# Patient Record
Sex: Female | Born: 1995 | Race: Black or African American | Hispanic: No | Marital: Single | Smoking: Never smoker
Health system: Southern US, Community
[De-identification: ages and names within clinical notes are randomized; demographics above are authoritative.]

## PROBLEM LIST (undated history)

## (undated) HISTORY — PX: NO PAST SURGERIES: SHX2092

---

## 2018-09-03 ENCOUNTER — Ambulatory Visit
Admission: EM | Admit: 2018-09-03 | Discharge: 2018-09-03 | Disposition: A | Discharge status: Home / Self Care | Payer: Self-pay | Attending: Urgent Care | Admitting: Urgent Care

## 2018-09-03 ENCOUNTER — Encounter: Payer: Self-pay | Admitting: Emergency Medicine

## 2018-09-03 ENCOUNTER — Other Ambulatory Visit: Payer: Self-pay

## 2018-09-03 DIAGNOSIS — N390 Urinary tract infection, site not specified: Secondary | ICD-10-CM

## 2018-09-03 DIAGNOSIS — Z3202 Encounter for pregnancy test, result negative: Secondary | ICD-10-CM

## 2018-09-03 LAB — URINALYSIS, COMPLETE (UACMP) WITH MICROSCOPIC
Bilirubin Urine: NEGATIVE
Glucose, UA: NEGATIVE mg/dL
Hgb urine dipstick: NEGATIVE
Ketones, ur: 15 mg/dL — AB
Leukocytes,Ua: NEGATIVE
Nitrite: POSITIVE — AB
Protein, ur: NEGATIVE mg/dL
RBC / HPF: NONE SEEN RBC/hpf (ref 0–5)
Specific Gravity, Urine: 1.025 (ref 1.005–1.030)
pH: 6.5 (ref 5.0–8.0)

## 2018-09-03 LAB — PREGNANCY, URINE: Preg Test, Ur: NEGATIVE

## 2018-09-03 MED ORDER — NITROFURANTOIN MONOHYD MACRO 100 MG PO CAPS: 100.0000 mg | ORAL_CAPSULE | Freq: Two times a day (BID) | ORAL | 0 refills | Status: AC

## 2018-09-03 NOTE — Discharge Instructions (Addendum)
It was very nice seeing you today in clinic. Thank you for entrusting me with your care.   As discussed, your urine is POSITIVE for infection. Will approach treatment as follows:  Prescription has been sent to your pharmacy for antibiotics.  Please pick up and take as directed. FINISH the entire course of medication even if you are feeling better.  A culture will be sent on your provided sample. If it comes back resistant to what I have prescribed you, someone will call you and let you know that we will need to change antibiotics. Increase fluid intake as much as possible to flush your urinary tract.  Water is always the best.  Avoid caffeine until your infection clears up, as it can contribute to painful bladder spasms.  May use Tylenol and/or Ibuprofen as needed for pain/fever. May use Azo products (over the counter) to help with pain/discomfort.   Make arrangements to follow up with your regular doctor in 1 week for re-evaluation. If your symptoms/condition worsens, please seek follow up care either here or in the ER. Please remember, our [facility] providers are "right here with you" when you need us.   Again, it was my pleasure to take care of you today. Thank you for choosing our clinic. I hope that you start to feel better quickly.   Ziza Hastings, MSN, APRN, FNP-C, CEN Advanced Practice Provider [facility] 

## 2018-09-03 NOTE — ED Triage Notes (Signed)
Pt c/o urinary frequency. Started 2 days ago. Denies dysuria or fever.

## 2018-09-04 NOTE — ED Provider Notes (Signed)
[place], [place]   Name: Adrienne Garner DOB: 12/09/1995 MRN: [id number] CSN: [id number] PCP: Patient, No Pcp Per  Arrival date and time:  09/03/18 1440  Chief Complaint:  Urinary Frequency   NOTE: Prior to seeing the patient today, I have reviewed the triage nursing documentation and vital signs. Clinical staff has updated patient's PMH/PSHx, current medication list, and drug allergies/intolerances to ensure comprehensive history available to assist in medical decision making.   History:   HPI: Adrienne Garner is a 23 y.o. female who presents today with complaints of urinary symptoms that began with acute onset 2 days ago. She complains frequency, but denies dysuria and urgency. Patient states, "this feels just like my UTIs have in the past".  She has not appreciated any gross hematuria, nor has she noticed her urine being malodorous. Patient denies any associated nausea, vomiting, fever, and chills. She has not experienced any pain in her lower back, flank area, or abdomen. Patient advises that she has a significant history for recurrent urinary tract infections. She denies any vaginal pain, bleeding, or discharge. Patient's last menstrual period was 08/02/2018 (approximate). There are no concerns that she is currently pregnant.   History reviewed. No pertinent past medical history.  Past Surgical History:  Procedure Laterality Date  . NO PAST SURGERIES      Family History  Problem Relation Age of Onset  . Diabetes Mother     Social History   Tobacco Use  . Smoking status: Never Smoker  . Smokeless tobacco: Never Used  Substance Use Topics  . Alcohol use: Not Currently  . Drug use: Not Currently    There are no active problems to display for this patient.   Home Medications:    No outpatient medications have been marked as taking for the 09/03/18 encounter [employer] Encounter).    Allergies:   Patient has no known allergies.  Review of Systems (ROS): Review of Systems   Constitutional: Negative for chills and fever.  Respiratory: Negative for cough and shortness of breath.   Cardiovascular: Negative for chest pain and palpitations.  Genitourinary: Positive for frequency. Negative for dyspareunia, dysuria, flank pain, genital sores, hematuria, pelvic pain, urgency, vaginal discharge and vaginal pain.  Musculoskeletal: Negative for back pain.  Neurological: Negative for dizziness, syncope, weakness and headaches.  All other systems reviewed and are negative.    Vital Signs: Today's Vitals   09/03/18 1456 09/03/18 1500 09/03/18 1608  BP:  102/74   Pulse:  71   Resp:  16   Temp:  98.3 F (36.8 C)   TempSrc:  Oral   SpO2:  100%   Weight: 98 lb (44.5 kg)    Height: 4\' 9"  (1.448 m)    PainSc: 2   2     Physical Exam: Physical Exam  Constitutional: She is oriented to person, place, and time and well-developed, well-nourished, and in no distress.  HENT:  Head: Normocephalic and atraumatic.  Mouth/Throat: Mucous membranes are normal.  Eyes: Pupils are equal, round, and reactive to light. EOM are normal.  Cardiovascular: Normal rate, regular rhythm, normal heart sounds and intact distal pulses. Exam reveals no gallop and no friction rub.  No murmur heard. Pulmonary/Chest: Effort normal and breath sounds normal. No respiratory distress. She has no wheezes. She has no rales.  Abdominal: Soft. Normal appearance and bowel sounds are normal. There is no hepatosplenomegaly. There is abdominal tenderness in the suprapubic area. There is no CVA tenderness.  Neurological: She is alert and oriented to  person, place, and time. Gait normal. GCS score is 15.  Skin: Skin is warm and dry. No rash noted.  Psychiatric: Mood, memory, affect and judgment normal.  Nursing note and vitals reviewed.   Urgent Care Treatments / Results:   LABS: PLEASE NOTE: all labs that were ordered this encounter are listed, however only abnormal results are displayed. Labs Reviewed   URINALYSIS, COMPLETE (UACMP) WITH MICROSCOPIC - Abnormal; Notable for the following components:      Result Value   APPearance HAZY (*)    Ketones, ur 15 (*)    Nitrite POSITIVE (*)    Bacteria, UA MANY (*)    All other components within normal limits  URINE CULTURE  PREGNANCY, URINE    EKG: -None  RADIOLOGY: No results found.  PROCEDURES: Procedures  MEDICATIONS RECEIVED THIS VISIT: Medications - No data to display  PERTINENT CLINICAL COURSE NOTES/UPDATES:   Initial Impression / Assessment and Plan / Urgent Care Course:  Pertinent labs & imaging results that were available during my care of the patient were personally reviewed by me and considered in my medical decision making (see lab/imaging section of note for values and interpretations).  Adrienne GuessShanice Garner is a 23 y.o. female who [facility] with complaints of Urinary Frequency   Patient is well appearing overall in clinic today. She does not appear to be in any acute distress. Presenting symptoms (see HPI) and exam as documented above. Urine HCG negative for pregnancy. UA was (+) for infection; reflex culture sent. Will treat with a 5 day course of Nitrofurantoin. Patient encouraged to complete the entire course of antibiotics even if she begins to feel better. She was advised that if culture demonstrates resistance to the prescribed antibiotic, she will be contacted and advised of the need to change the antibiotic being used to treat her infection. Patient encouraged to increase her fluid intake as much as possible. Discussed that water is always best to flush the urinary tract. She was advised to avoid caffeine containing fluids until her infections clears, as caffeine can cause her to experience painful bladder spasms. May use Tylenol and/or Ibuprofen as needed for pain/fever. May also use over the counter phenazopyridine to help relieve her current urinary pain.   Discussed follow up with primary care  physician in 1 week for re-evaluation. I have reviewed the follow up and strict return precautions for any new or worsening symptoms. Patient is aware of symptoms that would be deemed urgent/emergent, and would thus require further evaluation either here or in the emergency department. At the time of discharge, she verbalized understanding and consent with the discharge plan as it was reviewed with her. All questions were fielded by provider and/or clinic staff prior to patient discharge.    Final Clinical Impressions / Urgent Care Diagnoses:   Final diagnoses:  Urinary tract infection without hematuria, site unspecified    New Prescriptions:  [place] Controlled Substance Registry consulted? Not Applicable  Meds ordered this encounter  Medications  . nitrofurantoin, macrocrystal-monohydrate, (MACROBID) 100 MG capsule    Sig: Take 1 capsule (100 mg total) by mouth 2 (two) times daily.    Dispense:  10 capsule    Refill:  0    Recommended Follow up Care:  Patient encouraged to follow up with the following provider within the specified time frame, or sooner as dictated by the severity of her symptoms. As always, she was instructed that for any urgent/emergent care needs, she should seek care either  here or in the emergency department for more immediate evaluation.  NOTE: This note was prepared using Scientist, clinical (histocompatibility and immunogenetics)Dragon dictation software along with smaller Lobbyistphrase technology. Despite my best ability to proofread, there is the potential that transcriptional errors may still occur from this process, and are completely unintentional.    Verlee MonteGray, Jadelynn Boylan E, NP 09/04/18 1211

## 2018-09-06 LAB — URINE CULTURE: Culture: >=100000 — AB

## 2018-09-09 ENCOUNTER — Telehealth (HOSPITAL_COMMUNITY): Payer: Self-pay | Admitting: Emergency Medicine

## 2018-09-09 NOTE — Telephone Encounter (Signed)
Urine culture was positive for e coli and was given  macrobid at urgent care visit. Pt contacted and made aware, educated on completing antibiotic and to follow up if symptoms are persistent. Verbalized understanding.    

## 2020-04-19 ENCOUNTER — Emergency Department
Admission: EM | Admit: 2020-04-19 | Discharge: 2020-04-19 | Disposition: A | Discharge status: Home / Self Care | Payer: BLUE CROSS/BLUE SHIELD | Attending: Emergency Medicine | Admitting: Emergency Medicine

## 2020-04-19 ENCOUNTER — Emergency Department: Payer: BLUE CROSS/BLUE SHIELD

## 2020-04-19 ENCOUNTER — Encounter: Payer: Self-pay | Admitting: Emergency Medicine

## 2020-04-19 DIAGNOSIS — R112 Nausea with vomiting, unspecified: Secondary | ICD-10-CM | POA: Diagnosis present

## 2020-04-19 DIAGNOSIS — R197 Diarrhea, unspecified: Secondary | ICD-10-CM | POA: Insufficient documentation

## 2020-04-19 DIAGNOSIS — M549 Dorsalgia, unspecified: Secondary | ICD-10-CM | POA: Diagnosis not present

## 2020-04-19 DIAGNOSIS — R079 Chest pain, unspecified: Secondary | ICD-10-CM

## 2020-04-19 DIAGNOSIS — R0789 Other chest pain: Secondary | ICD-10-CM | POA: Diagnosis not present

## 2020-04-19 LAB — CBC
HCT: 39.5 % (ref 36.0–46.0)
Hemoglobin: 12.7 g/dL (ref 12.0–15.0)
MCH: 27.3 pg (ref 26.0–34.0)
MCHC: 32.2 g/dL (ref 30.0–36.0)
MCV: 84.9 fL (ref 80.0–100.0)
Platelets: 149 10*3/uL — ABNORMAL LOW (ref 150–400)
RBC: 4.65 MIL/uL (ref 3.87–5.11)
RDW: 14 % (ref 11.5–15.5)
WBC: 10.4 10*3/uL (ref 4.0–10.5)
nRBC: 0 % (ref 0.0–0.2)

## 2020-04-19 LAB — BASIC METABOLIC PANEL
Anion gap: 7 (ref 5–15)
BUN: 18 mg/dL (ref 6–20)
CO2: 22 mmol/L (ref 22–32)
Calcium: 9.4 mg/dL (ref 8.9–10.3)
Chloride: 109 mmol/L (ref 98–111)
Creatinine, Ser: 0.78 mg/dL (ref 0.44–1.00)
GFR, Estimated: >60 mL/min (ref >60)
Glucose, Bld: 119 mg/dL — ABNORMAL HIGH (ref 70–99)
Potassium: 3.6 mmol/L (ref 3.5–5.1)
Sodium: 138 mmol/L (ref 135–145)

## 2020-04-19 LAB — URINALYSIS, ROUTINE W REFLEX MICROSCOPIC
Bilirubin Urine: NEGATIVE
Glucose, UA: NEGATIVE mg/dL
Ketones, ur: 20 mg/dL — AB
Leukocytes,Ua: NEGATIVE
Nitrite: NEGATIVE
Protein, ur: 30 mg/dL — AB
RBC / HPF: >50 RBC/hpf — ABNORMAL HIGH (ref 0–5)
Specific Gravity, Urine: 1.032 — ABNORMAL HIGH (ref 1.005–1.030)
pH: 5 (ref 5.0–8.0)

## 2020-04-19 LAB — HEPATIC FUNCTION PANEL
ALT: 9 U/L (ref 0–44)
AST: 19 U/L (ref 15–41)
Albumin: 4.5 g/dL (ref 3.5–5.0)
Alkaline Phosphatase: 51 U/L (ref 38–126)
Bilirubin, Direct: 0.2 mg/dL (ref 0.0–0.2)
Indirect Bilirubin: 1 mg/dL — ABNORMAL HIGH (ref 0.3–0.9)
Total Bilirubin: 1.2 mg/dL (ref 0.3–1.2)
Total Protein: 7.9 g/dL (ref 6.5–8.1)

## 2020-04-19 LAB — TROPONIN I (HIGH SENSITIVITY)
Troponin I (High Sensitivity): <2 ng/L (ref <18)
Troponin I (High Sensitivity): 3 ng/L (ref <18)

## 2020-04-19 LAB — POC URINE PREG, ED: Preg Test, Ur: NEGATIVE

## 2020-04-19 LAB — LIPASE, BLOOD: Lipase: 39 U/L (ref 11–51)

## 2020-04-19 MED ORDER — KETOROLAC TROMETHAMINE 30 MG/ML IJ SOLN
30.0000 mg | Freq: Once | INTRAMUSCULAR | Status: AC
  Administered 2020-04-19: 30 mg via INTRAVENOUS
  Filled 2020-04-19: qty 1

## 2020-04-19 MED ORDER — SODIUM CHLORIDE 0.9 % IV BOLUS (SEPSIS)
1000.0000 mL | Freq: Once | INTRAVENOUS | Status: AC
  Administered 2020-04-19: 1000 mL via INTRAVENOUS

## 2020-04-19 MED ORDER — ONDANSETRON 4 MG PO TBDP: 4.0000 mg | ORAL_TABLET | Freq: Four times a day (QID) | ORAL | 0 refills | Status: AC | PRN

## 2020-04-19 NOTE — ED Triage Notes (Signed)
Pt arrived via [facility] from home where she called out due to continued N/V/D since 1800 on 4/3 and then this AM a new onset of left sided chest pain, 6/10 pain. Pt denies SOB. Pt is alert and oriented on arrival to ED.

## 2020-04-19 NOTE — Discharge Instructions (Signed)
You may alternate Tylenol 1000 mg every 6 hours as needed for pain, fever and Ibuprofen 800 mg every 8 hours as needed for pain, fever.  Please take Ibuprofen with food.  Do not take more than 4000 mg of Tylenol (acetaminophen) in a 24 hour period.  You may take over-the-counter Imodium as needed for diarrhea.  I recommend a bland diet for the next several days.

## 2020-04-19 NOTE — ED Provider Notes (Signed)
Adrienne Garner  ____________________________________________   Event Date/Time   First MD Initiated Contact with Patient 04/19/20 [phone]     (approximate)  I have reviewed the triage vital signs and the nursing notes.   HISTORY  Chief Complaint No chief complaint on file.    HPI Adrienne Garner is a 25 y.o. female with no significant past medical history who presents to the emergency department with complaints of nausea, vomiting diarrhea that started last night.  She states she had some abdominal discomfort but this improved after Zofran in triage.  She also reports having right-sided chest tightness that started after her vomiting and diffuse back pain.  She denies dysuria, hematuria, vaginal bleeding or discharge.  She has had a left salpingectomy secondary to ectopic pregnancy.  No bad food exposures.  No sick contacts.  No fever or cough.  No shortness of breath.  No history of PE, DVT, exogenous estrogen use, recent fractures, surgery, trauma, hospitalization, prolonged travel or other immobilization. No lower extremity swelling or pain. No calf tenderness.         History reviewed. No pertinent past medical history.  There are no problems to display for this patient.   Past Surgical History:  Procedure Laterality Date  . NO PAST SURGERIES      Prior to Admission medications   Medication Sig Start Date End Date Taking? Authorizing Provider  ondansetron (ZOFRAN ODT) 4 MG disintegrating tablet Take 1 tablet (4 mg total) by mouth every 6 (six) hours as needed for nausea or vomiting. 04/19/20  Yes Kaeo Jacome, Layla Maw, DO  nitrofurantoin, macrocrystal-monohydrate, (MACROBID) 100 MG capsule Take 1 capsule (100 mg total) by mouth 2 (two) times daily. 09/03/18   Verlee Monte, NP    Allergies Patient has no known allergies.  Family History  Problem Relation Age of Onset  . Diabetes Mother     Social History Social History    Tobacco Use  . Smoking status: Never Smoker  . Smokeless tobacco: Never Used  Vaping Use  . Vaping Use: Never used  Substance Use Topics  . Alcohol use: Not Currently  . Drug use: Not Currently    Review of Systems Constitutional: No fever. Eyes: No visual changes. ENT: No sore throat. Cardiovascular: + chest pain. Respiratory: Denies shortness of breath. Gastrointestinal: + nausea, vomiting, diarrhea. Genitourinary: Negative for dysuria. Musculoskeletal: Negative for back pain. Skin: Negative for rash. Neurological: Negative for focal weakness or numbness.  ____________________________________________   PHYSICAL EXAM:  VITAL SIGNS: ED Triage Vitals  Enc Vitals Group     BP 04/19/20 0623 107/66     Pulse Rate 04/19/20 0623 (!) 106     Resp 04/19/20 0623 16     Temp 04/19/20 0623 98.2 F (36.8 C)     Temp Source 04/19/20 0623 Oral     SpO2 04/19/20 0623 99 %     Weight 04/19/20 0419 102 lb (46.3 kg)     Height 04/19/20 0419 4\' 9"  (1.448 m)     Head Circumference --      Peak Flow --      Pain Score 04/19/20 0623 5     Pain Loc --      Pain Edu? --      Excl. in GC? --    CONSTITUTIONAL: Alert and oriented and responds appropriately to questions. Well-appearing; well-nourished HEAD: Normocephalic EYES: Conjunctivae clear, pupils appear equal, EOM appear intact ENT: normal nose; moist mucous membranes NECK:  Supple, normal ROM CARD: RRR; S1 and S2 appreciated; no murmurs, no clicks, no rubs, no gallops RESP: Normal chest excursion without splinting or tachypnea; breath sounds clear and equal bilaterally; no wheezes, no rhonchi, no rales, no hypoxia or respiratory distress, speaking full sentences ABD/GI: Normal bowel sounds; non-distended; soft, non-tender, no rebound, no guarding, no peritoneal signs, no hepatosplenomegaly BACK: The back appears normal, no CVA tenderness, no midline spinal tenderness or step-off or deformity EXT: Normal ROM in all joints; no  deformity noted, no edema; no cyanosis, no calf tenderness or calf swelling SKIN: Normal color for age and race; warm; no rash on exposed skin NEURO: Moves all extremities equally, normal gait PSYCH: The patient's mood and manner are appropriate.  ____________________________________________   LABS (all labs ordered are listed, but only abnormal results are displayed)  Labs Reviewed  BASIC METABOLIC PANEL - Abnormal; Notable for the following components:      Result Value   Glucose, Bld 119 (*)    All other components within normal limits  CBC - Abnormal; Notable for the following components:   Platelets 149 (*)    All other components within normal limits  HEPATIC FUNCTION PANEL - Abnormal; Notable for the following components:   Indirect Bilirubin 1.0 (*)    All other components within normal limits  URINALYSIS, ROUTINE W REFLEX MICROSCOPIC - Abnormal; Notable for the following components:   Color, Urine AMBER (*)    APPearance HAZY (*)    Specific Gravity, Urine 1.032 (*)    Hgb urine dipstick LARGE (*)    Ketones, ur 20 (*)    Protein, ur 30 (*)    RBC / HPF >50 (*)    Bacteria, UA RARE (*)    All other components within normal limits  LIPASE, BLOOD  POC URINE PREG, ED  TROPONIN I (HIGH SENSITIVITY)  TROPONIN I (HIGH SENSITIVITY)   ____________________________________________  EKG   EKG Interpretation  Date/Time:  Monday April 19 2020 04:20:18 EDT Ventricular Rate:  116 PR Interval:  134 QRS Duration: 88 QT Interval:  318 QTC Calculation: 442 R Axis:   73 Text Interpretation: Sinus tachycardia Indeterminate axis Nonspecific ST abnormality Abnormal ECG Confirmed by Rochele Raring [id number]) on 04/19/2020 6:12:50 AM       ____________________________________________  RADIOLOGY [name], personally viewed and evaluated these images (plain radiographs) as part of my medical decision making, as well as reviewing the written report by the radiologist.  ED MD  interpretation: Chest x-ray clear.  Official radiology report(s): DG Chest 2 View  Result Date: 04/19/2020 CLINICAL DATA:  25 year old female with chest pain, nausea vomiting diarrhea. EXAM: CHEST - 2 VIEW COMPARISON:  None. FINDINGS: Normal lung volumes and mediastinal contours. Visualized tracheal air column is within normal limits. Both lungs appear clear. No pneumothorax or pleural effusion. Subtle thoracolumbar scoliosis. No other osseous abnormality identified. Incidental breast implants. Negative visible bowel gas pattern. No pneumoperitoneum identified. IMPRESSION: Negative.  No cardiopulmonary abnormality. Electronically Signed   By: Odessa Fleming M.D.   On: 04/19/2020 05:14    ____________________________________________   PROCEDURES  Procedure(s) performed (including Critical Care):  Procedures  ____________________________________________   INITIAL IMPRESSION / ASSESSMENT AND PLAN / ED COURSE  As part of my medical decision making, I reviewed the following data within the electronic MEDICAL RECORD NUMBER Nursing notes reviewed and incorporated, Labs reviewed , EKG interpreted , Old EKG reviewed, Old chart reviewed, Radiograph reviewed  and Notes from prior ED visits  Patient here with nausea, vomiting and diarrhea.  Suspect viral illness versus foodborne illness.  Her abdominal exam is benign.  Doubt cholecystitis, pancreatitis, colitis, diverticulitis, appendicitis, perforation, bowel obstruction.  Labs obtained in triage are unremarkable.  Normal white blood cell count.  Normal electrolytes, renal function, LFTs, lipase.  She is also complaining of atypical chest pain.  EKG is nonischemic.  First troponin is normal.  Chest x-ray clear.  Doubt ACS, PE, dissection.  May be from frequent vomiting.  Will treat with Toradol and reassess.  Also complaining of diffuse back pain which also may be due to frequent vomiting.  Will obtain urinalysis to evaluate for possible UTI,  pyelonephritis.  Her pregnancy test today is negative.  She reports feeling better after Zofran in triage.  Will reassess after IV fluids, Toradol.  ED PROGRESS  Patient's urine shows moderate amount of ketones.  She does have red blood cells but is on her menstrual cycle.  No nitrites or leukocytes.  6-10 white blood cells and rare bacteria.  She is not having urinary symptoms.  Doubt kidney stone.  Second troponin is normal.  She reports feeling better and has been able to tolerate p.o.  Her heart rate has also improved with IV fluids.  I feel she is safe to be discharged home and alternate Tylenol and Motrin as needed.  Will discharge with prescription of Zofran.  Discussed return precautions.  Husband at bedside to drive her home.   At this time, I do not feel there is any life-threatening condition present. I have reviewed, interpreted and discussed all results (EKG, imaging, lab, urine as appropriate) and exam findings with patient/family. I have reviewed nursing notes and appropriate previous records.  I feel the patient is safe to be discharged home without further emergent workup and can continue workup as an outpatient as needed. Discussed usual and customary return precautions. Patient/family verbalize understanding and are comfortable with this plan.  Outpatient follow-up has been provided as needed. All questions have been answered.  ____________________________________________   FINAL CLINICAL IMPRESSION(S) / ED DIAGNOSES  Final diagnoses:  Nausea, vomiting and diarrhea  Right-sided chest pain     ED Discharge Orders         Ordered    ondansetron (ZOFRAN ODT) 4 MG disintegrating tablet  Every 6 hours PRN        04/19/20 0724          *Please Garner:  Necha Harries was evaluated in Emergency Department on 04/19/2020 for the symptoms described in the history of present illness. She was evaluated in the context of the global COVID-19 pandemic, which necessitated consideration  that the patient might be at risk for infection with the SARS-CoV-2 virus that causes COVID-19. Institutional protocols and algorithms that pertain to the evaluation of patients at risk for COVID-19 are in a state of rapid change based on information released by regulatory bodies including the CDC and federal and state organizations. These policies and algorithms were followed during the patient's care in the ED.  Some ED evaluations and interventions may be delayed as a result of limited staffing during and the pandemic.*   Garner:  This document was prepared using Dragon voice recognition software and may include unintentional dictation errors.   Kemia Wendel, Layla Maw, DO 04/19/20 [phone]

## 2020-04-19 NOTE — ED Notes (Signed)
Signature pad not available; pt verbalized understanding of discharge instructions. No question or concerns.

## 2020-04-19 NOTE — ED Notes (Signed)
Pt drank a few sips of water tolerated well

## 2022-01-31 IMAGING — CR DG CHEST 2V
2 series · 2 of 2 positions shown · non-contrast
Comparison: None.

CLINICAL DATA: 24-year-old female with chest pain, nausea vomiting
diarrhea.

EXAM:
CHEST - 2 VIEW

[chest pa]
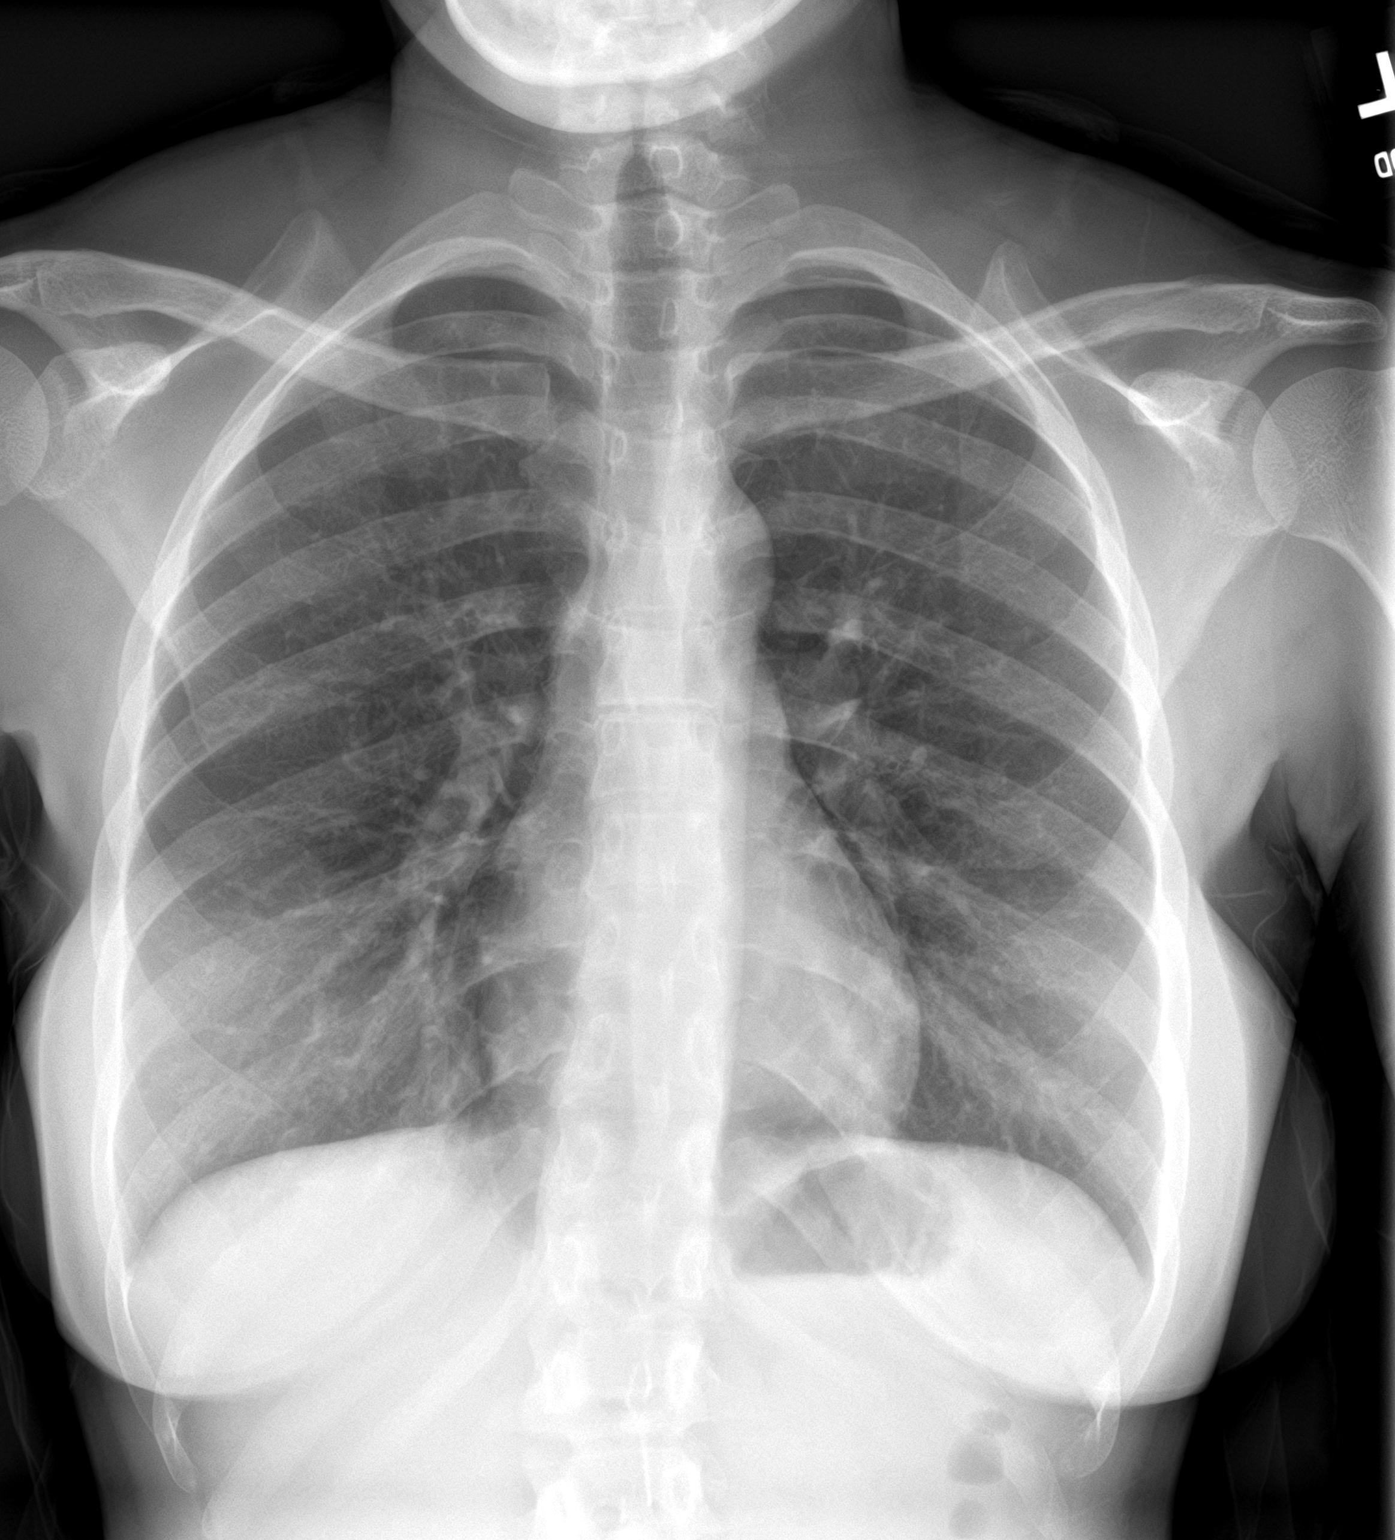

[chest lat]
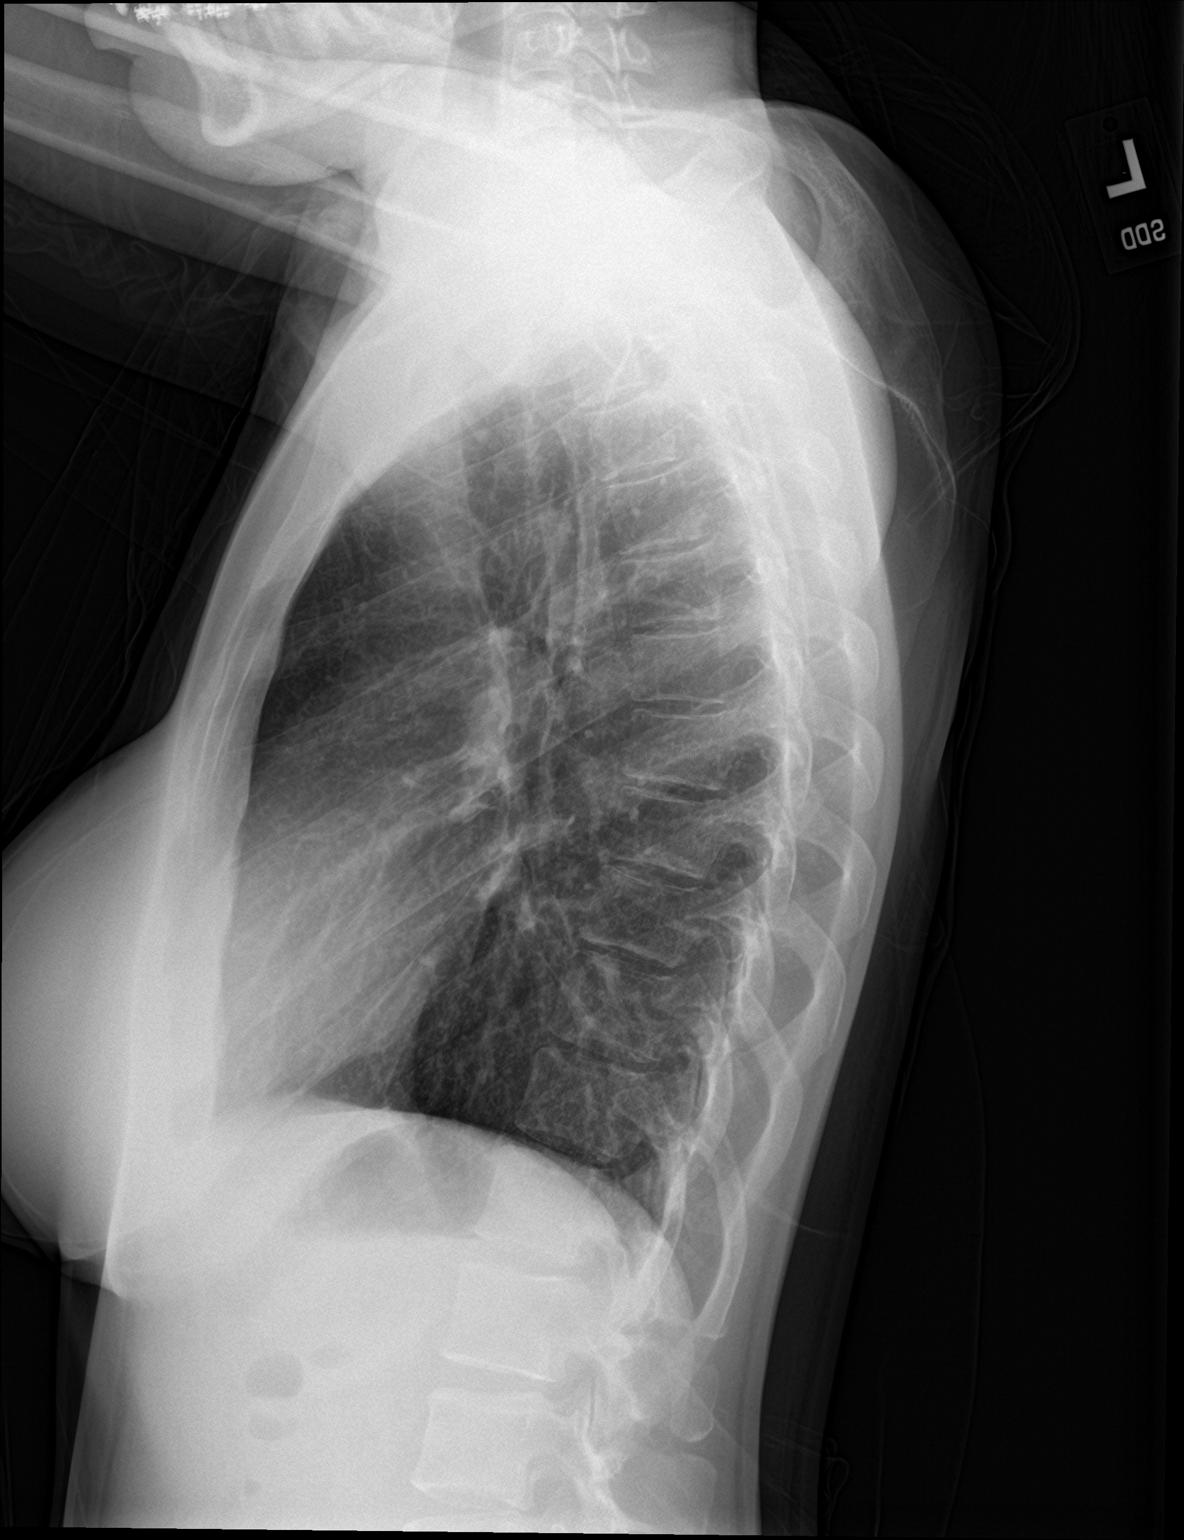

[2 of 2 positions shown; findings below may reference images not displayed]

FINDINGS: Normal lung volumes and mediastinal contours. Visualized tracheal
air column is within normal limits. Both lungs appear clear. No
pneumothorax or pleural effusion.

Subtle thoracolumbar scoliosis. No other osseous abnormality
identified. Incidental breast implants. Negative visible bowel gas
pattern. No pneumoperitoneum identified.
IMPRESSION: Negative.  No cardiopulmonary abnormality.
